# Patient Record
Sex: Male | Born: 1990 | Race: White | Hispanic: No | Marital: Married | Smoking: Never smoker
Health system: Southern US, Community
[De-identification: ages and names within clinical notes are randomized; demographics above are authoritative.]

## PROBLEM LIST (undated history)

## (undated) DIAGNOSIS — F1990 Other psychoactive substance use, unspecified, uncomplicated: Secondary | ICD-10-CM

## (undated) DIAGNOSIS — T401X1A Poisoning by heroin, accidental (unintentional), initial encounter: Secondary | ICD-10-CM

## (undated) DIAGNOSIS — F1910 Other psychoactive substance abuse, uncomplicated: Secondary | ICD-10-CM

---

## 2012-04-05 ENCOUNTER — Emergency Department (HOSPITAL_BASED_OUTPATIENT_CLINIC_OR_DEPARTMENT_OTHER): Payer: Self-pay

## 2012-04-05 ENCOUNTER — Encounter (HOSPITAL_BASED_OUTPATIENT_CLINIC_OR_DEPARTMENT_OTHER): Payer: Self-pay | Admitting: *Deleted

## 2012-04-05 ENCOUNTER — Emergency Department (HOSPITAL_BASED_OUTPATIENT_CLINIC_OR_DEPARTMENT_OTHER)
Admission: EM | Admit: 2012-04-05 | Discharge: 2012-04-05 | Disposition: A | Discharge status: Home / Self Care | Payer: Self-pay | Attending: Emergency Medicine | Admitting: Emergency Medicine

## 2012-04-05 DIAGNOSIS — R51 Headache: Secondary | ICD-10-CM | POA: Insufficient documentation

## 2012-04-05 DIAGNOSIS — R112 Nausea with vomiting, unspecified: Secondary | ICD-10-CM | POA: Insufficient documentation

## 2012-04-05 DIAGNOSIS — R109 Unspecified abdominal pain: Secondary | ICD-10-CM | POA: Insufficient documentation

## 2012-04-05 DIAGNOSIS — R1110 Vomiting, unspecified: Secondary | ICD-10-CM | POA: Insufficient documentation

## 2012-04-05 DIAGNOSIS — R3129 Other microscopic hematuria: Secondary | ICD-10-CM | POA: Insufficient documentation

## 2012-04-05 LAB — BASIC METABOLIC PANEL
BUN: 11 mg/dL (ref 6–23)
CO2: 31 mEq/L (ref 19–32)
Calcium: 9.6 mg/dL (ref 8.4–10.5)
Chloride: 99 mEq/L (ref 96–112)
Creatinine, Ser: 1.1 mg/dL (ref 0.50–1.35)
GFR calc Af Amer: >90 mL/min (ref >90)
GFR calc non Af Amer: >90 mL/min (ref >90)
Glucose, Bld: 95 mg/dL (ref 70–99)
Potassium: 4 mEq/L (ref 3.5–5.1)
Sodium: 140 mEq/L (ref 135–145)

## 2012-04-05 LAB — CBC WITH DIFFERENTIAL/PLATELET
Basophils Absolute: 0.1 10*3/uL (ref 0.0–0.1)
Basophils Relative: 1 % (ref 0–1)
Eosinophils Absolute: 0.9 10*3/uL — ABNORMAL HIGH (ref 0.0–0.7)
Eosinophils Relative: 10 % — ABNORMAL HIGH (ref 0–5)
HCT: 47.8 % (ref 39.0–52.0)
Hemoglobin: 16.1 g/dL (ref 13.0–17.0)
Lymphocytes Relative: 25 % (ref 12–46)
Lymphs Abs: 2.3 10*3/uL (ref 0.7–4.0)
MCH: 30.4 pg (ref 26.0–34.0)
MCHC: 33.7 g/dL (ref 30.0–36.0)
MCV: 90.4 fL (ref 78.0–100.0)
Monocytes Absolute: 1 10*3/uL (ref 0.1–1.0)
Monocytes Relative: 11 % (ref 3–12)
Neutro Abs: 4.9 10*3/uL (ref 1.7–7.7)
Neutrophils Relative %: 53 % (ref 43–77)
Platelets: 211 10*3/uL (ref 150–400)
RBC: 5.29 MIL/uL (ref 4.22–5.81)
RDW: 13.5 % (ref 11.5–15.5)
WBC: 9.3 10*3/uL (ref 4.0–10.5)

## 2012-04-05 LAB — URINE MICROSCOPIC-ADD ON

## 2012-04-05 LAB — URINALYSIS, ROUTINE W REFLEX MICROSCOPIC
Glucose, UA: NEGATIVE mg/dL
Ketones, ur: NEGATIVE mg/dL
Nitrite: NEGATIVE
Specific Gravity, Urine: 1.025 (ref 1.005–1.030)
pH: 6.5 (ref 5.0–8.0)

## 2012-04-05 MED ORDER — IOHEXOL 300 MG/ML  SOLN
20.0000 mL | Freq: Once | INTRAMUSCULAR | Status: AC | PRN
  Administered 2012-04-05: 40 mL via ORAL

## 2012-04-05 MED ORDER — ONDANSETRON HCL 4 MG/2ML IJ SOLN
4.0000 mg | Freq: Once | INTRAMUSCULAR | Status: AC
  Administered 2012-04-05: 4 mg via INTRAVENOUS
  Filled 2012-04-05: qty 2

## 2012-04-05 MED ORDER — IOHEXOL 300 MG/ML  SOLN
100.0000 mL | Freq: Once | INTRAMUSCULAR | Status: AC | PRN
  Administered 2012-04-05: 100 mL via INTRAVENOUS

## 2012-04-05 MED ORDER — PROMETHAZINE HCL 25 MG PO TABS: 25.0000 mg | ORAL_TABLET | Freq: Four times a day (QID) | ORAL | Status: AC | PRN

## 2012-04-05 MED ORDER — MORPHINE SULFATE 2 MG/ML IJ SOLN
2.0000 mg | Freq: Once | INTRAMUSCULAR | Status: AC
  Administered 2012-04-05: 2 mg via INTRAVENOUS
  Filled 2012-04-05: qty 1

## 2012-04-05 NOTE — ED Notes (Signed)
Patient states he has had vomiting and abdominal cramping  for the last 2 days, which is associated with decreased energy.  Denies diarrhea.

## 2012-04-05 NOTE — ED Provider Notes (Signed)
History  This chart was scribed for Rolan Bucco, MD by Shari Heritage. The patient was seen in room MH08/MH08. Patient's care was started at 1902.     CSN: [id number]  Arrival date & time 04/05/12  Mikle Bosworth   First MD Initiated Contact with Patient 04/05/12 1950      Chief Complaint  Patient presents with  . Emesis  . Abdominal Pain    The history is provided by the patient. No language interpreter was used.   Alexsander Cavins is a 21 y.o. male who presents to the Emergency Department complaining of persistent emesis with associated mild to moderate, lower abdominal cramping and HA onset 2 days ago. Patient says the emesis started immediately after he ate dinner on Friday. He states that he vomits whenever he eats or drinks. Patient has felt warm, but he hasn't measured his temperature at home. No diarrhea. No blood in emesis. No dysuria. No cough or congestion. Patient denies any recent colds. He also denies any abdominal surgical history. Her reports no other significant medical or family history. He has never smoked.  History  Substance Use Topics  . Smoking status: Never Smoker   . Smokeless tobacco: Not on file  . Alcohol Use: No      Review of Systems  Constitutional: Negative for fever, chills, diaphoresis and fatigue.  HENT: Negative for congestion, rhinorrhea and sneezing.   Eyes: Negative.   Respiratory: Negative for cough, chest tightness and shortness of breath.   Cardiovascular: Negative for chest pain and leg swelling.  Gastrointestinal: Positive for nausea, vomiting and abdominal pain. Negative for diarrhea and blood in stool.  Genitourinary: Negative for dysuria, frequency, hematuria, flank pain and difficulty urinating.  Musculoskeletal: Negative for back pain and arthralgias.  Skin: Negative for rash.  Neurological: Positive for headaches. Negative for dizziness, speech difficulty, weakness and numbness.    Allergies  Review of patient's allergies indicates no  known allergies.  Home Medications   Current Outpatient Rx  Name Route Sig Dispense Refill  . PROMETHAZINE HCL 25 MG PO TABS Oral Take 1 tablet (25 mg total) by mouth every 6 (six) hours as needed for nausea. 30 tablet 0    BP 132/91  Pulse 50  Temp 98.3 F (36.8 C) (Oral)  Resp 20  Ht 5\' 10"  (1.778 m)  Wt 200 lb (90.719 kg)  BMI 28.70 kg/m2  SpO2 99%  Physical Exam  Constitutional: He is oriented to person, place, and time. He appears well-developed and well-nourished.  HENT:  Head: Normocephalic and atraumatic.  Eyes: Pupils are equal, round, and reactive to light.  Neck: Normal range of motion. Neck supple.  Cardiovascular: Normal rate, regular rhythm and normal heart sounds.   Pulmonary/Chest: Effort normal and breath sounds normal. No respiratory distress. He has no wheezes. He has no rales. He exhibits no tenderness.  Abdominal: Soft. Bowel sounds are normal. There is tenderness in the right lower quadrant. There is no rebound and no guarding.       Moderate tenderness in RLQ. Neg heel to tap. Neg obturater sign.  Genitourinary:       No testicular pain.  Musculoskeletal: Normal range of motion. He exhibits no edema.  Lymphadenopathy:    He has no cervical adenopathy.  Neurological: He is alert and oriented to person, place, and time.  Skin: Skin is warm and dry. No rash noted.  Psychiatric: He has a normal mood and affect.    ED Course  Procedures (including critical care time) DIAGNOSTIC  STUDIES: Oxygen Saturation is 99% on room air, normal by my interpretation.    COORDINATION OF CARE: 8:09pm- Patient informed of current plan for treatment and evaluation and agrees with plan at this time.   Results for orders placed during the hospital encounter of 04/05/12  CBC WITH DIFFERENTIAL      Component Value Range   WBC 9.3  4.0 - 10.5 K/uL   RBC 5.29  4.22 - 5.81 MIL/uL   Hemoglobin 16.1  13.0 - 17.0 g/dL   HCT 16.1  09.6 - 04.5 %   MCV 90.4  78.0 - 100.0 fL    MCH 30.4  26.0 - 34.0 pg   MCHC 33.7  30.0 - 36.0 g/dL   RDW 40.9  81.1 - 91.4 %   Platelets 211  150 - 400 K/uL   Neutrophils Relative 53  43 - 77 %   Neutro Abs 4.9  1.7 - 7.7 K/uL   Lymphocytes Relative 25  12 - 46 %   Lymphs Abs 2.3  0.7 - 4.0 K/uL   Monocytes Relative 11  3 - 12 %   Monocytes Absolute 1.0  0.1 - 1.0 K/uL   Eosinophils Relative 10 (*) 0 - 5 %   Eosinophils Absolute 0.9 (*) 0.0 - 0.7 K/uL   Basophils Relative 1  0 - 1 %   Basophils Absolute 0.1  0.0 - 0.1 K/uL  BASIC METABOLIC PANEL      Component Value Range   Sodium 140  135 - 145 mEq/L   Potassium 4.0  3.5 - 5.1 mEq/L   Chloride 99  96 - 112 mEq/L   CO2 31  19 - 32 mEq/L   Glucose, Bld 95  70 - 99 mg/dL   BUN 11  6 - 23 mg/dL   Creatinine, Ser 7.82  0.50 - 1.35 mg/dL   Calcium 9.6  8.4 - 95.6 mg/dL   GFR calc non Af Amer >90  >90 mL/min   GFR calc Af Amer >90  >90 mL/min  URINALYSIS, ROUTINE W REFLEX MICROSCOPIC      Component Value Range   Color, Urine AMBER (*) YELLOW   APPearance CLEAR  CLEAR   Specific Gravity, Urine 1.025  1.005 - 1.030   pH 6.5  5.0 - 8.0   Glucose, UA NEGATIVE  NEGATIVE mg/dL   Hgb urine dipstick SMALL (*) NEGATIVE   Bilirubin Urine SMALL (*) NEGATIVE   Ketones, ur NEGATIVE  NEGATIVE mg/dL   Protein, ur NEGATIVE  NEGATIVE mg/dL   Urobilinogen, UA 1.0  0.0 - 1.0 mg/dL   Nitrite NEGATIVE  NEGATIVE   Leukocytes, UA NEGATIVE  NEGATIVE  URINE MICROSCOPIC-ADD ON      Component Value Range   Squamous Epithelial / LPF RARE  RARE   WBC, UA 0-2  <3 WBC/hpf   RBC / HPF 7-10  <3 RBC/hpf   Bacteria, UA MANY (*) RARE   Urine-Other MUCOUS PRESENT      Ct Abdomen Pelvis W Contrast  04/05/2012  *RADIOLOGY REPORT*  Clinical Data: 21 year old man with right lower quadrant pain and cramping.  Headache for 2 days.  No significant medical history.  CT ABDOMEN AND PELVIS WITH CONTRAST  Technique:  Multidetector CT imaging of the abdomen and pelvis was performed following the standard protocol  during bolus administration of intravenous contrast.  Contrast: 40mL OMNIPAQUE IOHEXOL 300 MG/ML  SOLN, OMNIPAQUE IOHEXOL 300 MG/ML  SOLN  Comparison: None.  Findings: Dependent atelectasis at the lung bases.  Heart appears within normal limits.  Fatty liver.  The spleen appears normal. Gallbladder and common bile duct appear normal.  Pancreas within normal limits.  Adrenal glands and kidneys also normal.  Both ureters are within normal limits.  Normal appendix identified in the right lower quadrant.  Urinary bladder normal.  No free fluid in the anatomic pelvis.  Small bowel is normal.  Colon appears within normal limits.  No adenopathy or inflammatory changes. Phlebolith is present adjacent to the right inguinal canal.  The abdominal vasculature is within normal limits. 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  No aggressive osseous lesions.  IMPRESSION: Fatty liver.  No acute abnormality.  Original Report Authenticated By: Andreas Newport, M.D.     1. Abdominal pain   2. Microscopic hematuria       MDM  Pt feeling much better after fluids.  Repeat abdominal exam benign.  Will f/u this week with Dr. Littie Deeds in [place], advised will need recheck of urine to make sure that blood clears.  Return if pain or vomiting worsens.      I personally performed the services described in this documentation, which was scribed in my presence.  The recorded information has been reviewed and considered.    Rolan Bucco, MD 04/05/12 2238

## 2014-01-15 IMAGING — CT CT ABD-PELV W/ CM
2 of 4 series · 17 of 46 positions shown, 19 images · IV contrast (APPLIED)
Comparison: None.

CLINICAL DATA: 21-year-old man with right lower quadrant pain and
cramping.  Headache for 2 days.  No significant medical history.

CT ABDOMEN AND PELVIS WITH CONTRAST
TECHNIQUE: Multidetector CT imaging of the abdomen and pelvis was
performed following the standard protocol during bolus
administration of intravenous contrast.
Contrast: 40mL OMNIPAQUE IOHEXOL 300 MG/ML  SOLN, 100mL OMNIPAQUE
IOHEXOL 300 MG/ML  SOLN

[Series 2: abd/pelvis 5.0 b31f · axial · 0.76mm/px · z∈[-472,-12]mm · 14 of 101 slices shown, 16 images]
[im 5/101  soft-tissue]
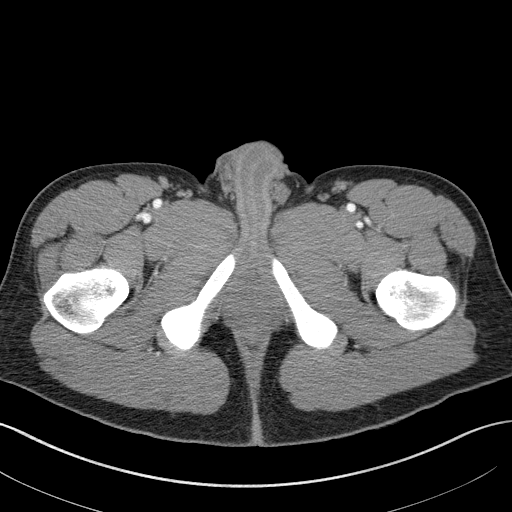
[im 5/101  bone]
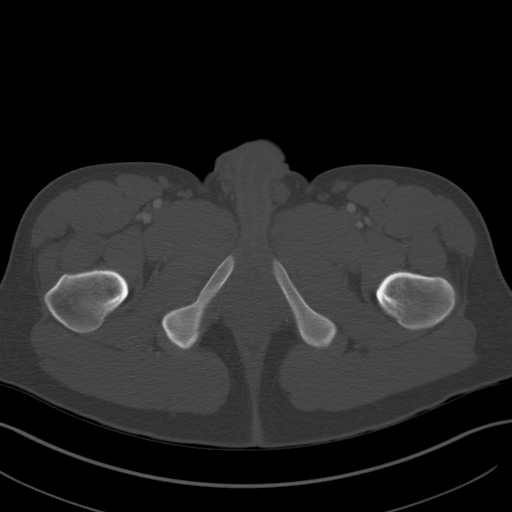
[im 13/101  soft-tissue]
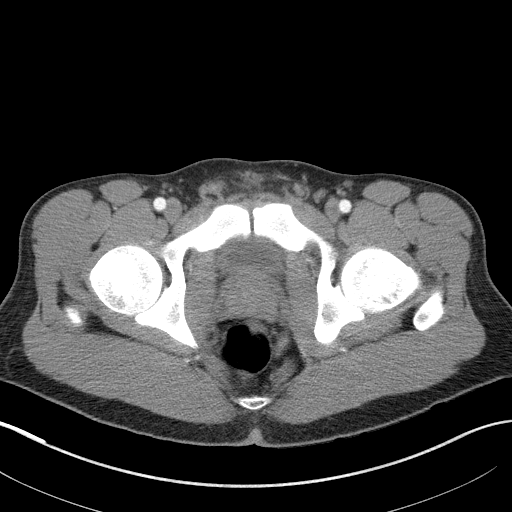
[im 21/101  soft-tissue]
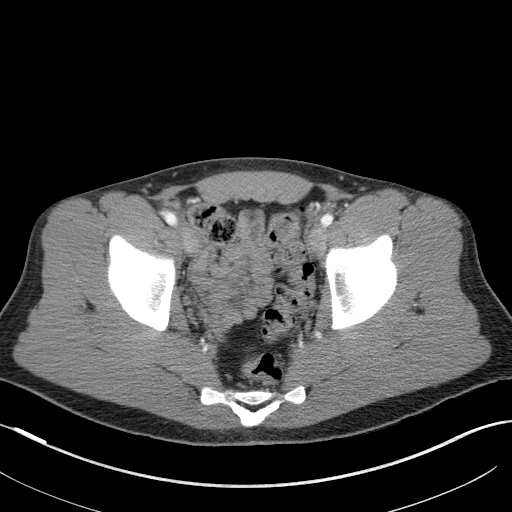
[im 29/101  soft-tissue]
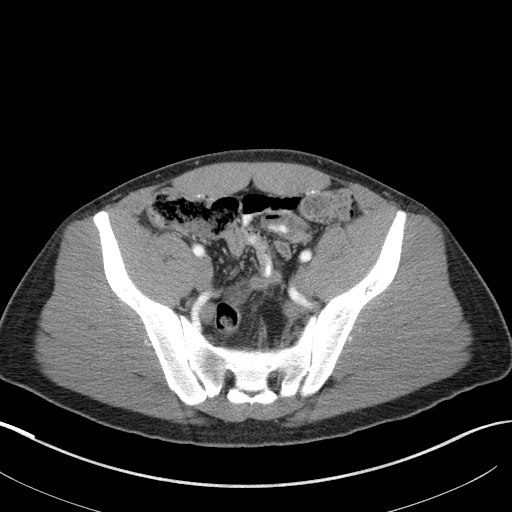
[im 33/101  soft-tissue]
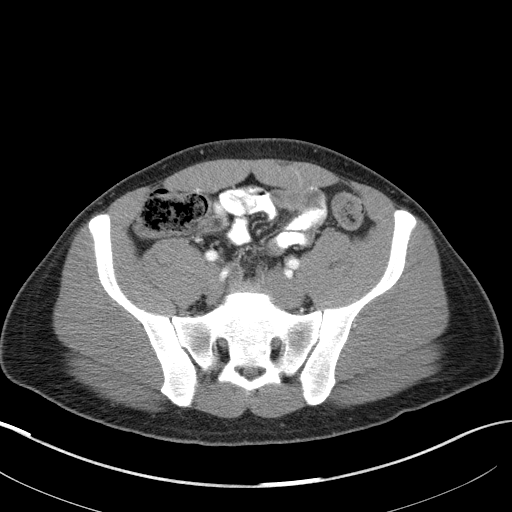
[im 41/101  soft-tissue]
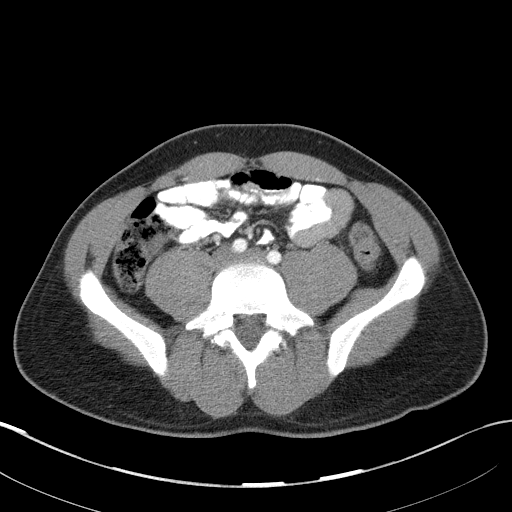
[im 49/101  soft-tissue]
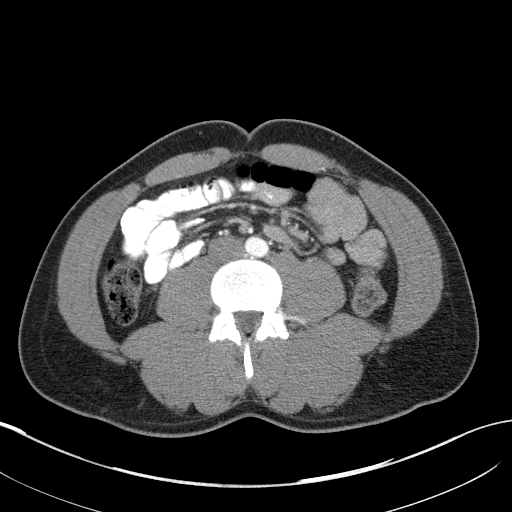
[im 53/101  soft-tissue]
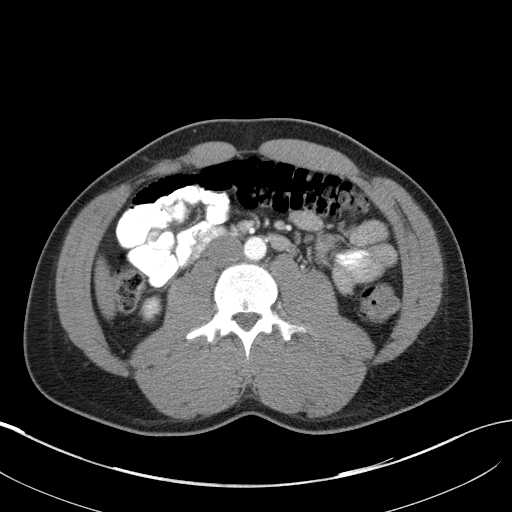
[im 61/101  soft-tissue]
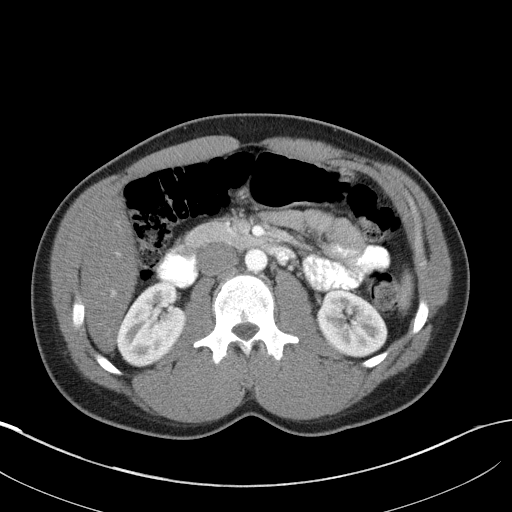
[im 61/101  bone]
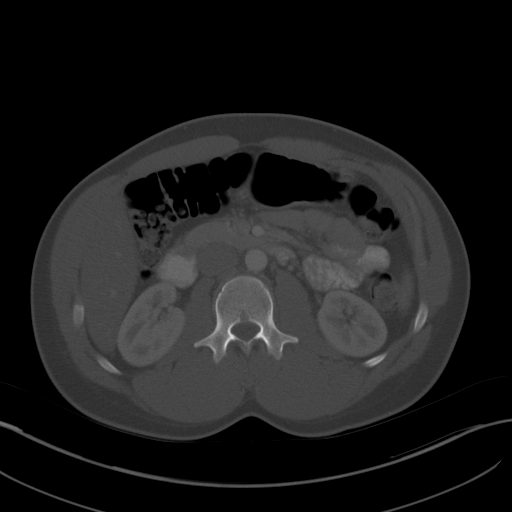
[im 69/101  soft-tissue]
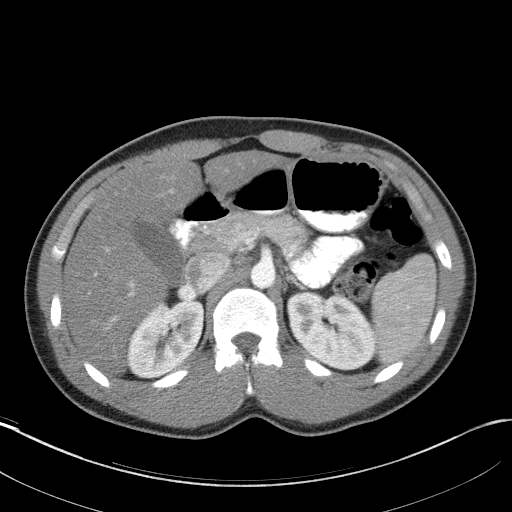
[im 77/101  soft-tissue]
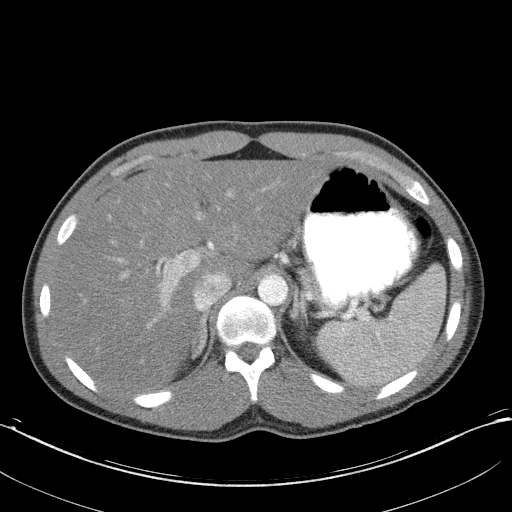
[im 81/101  soft-tissue]
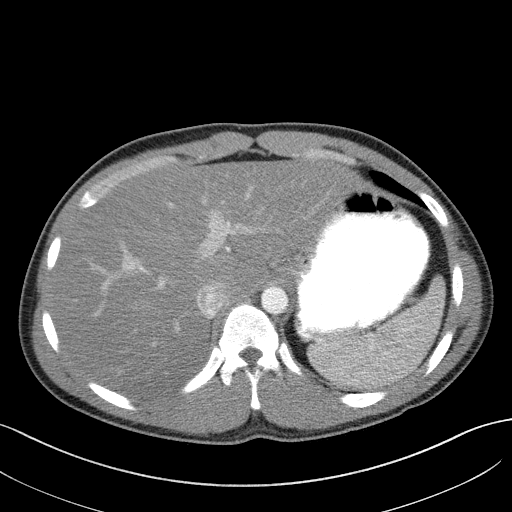
[im 89/101  soft-tissue]
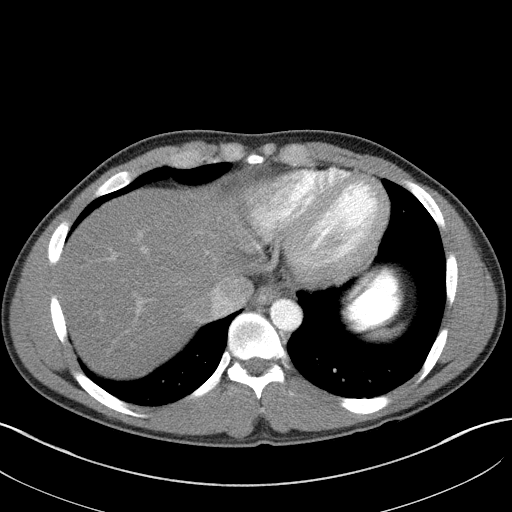
[im 97/101  soft-tissue]
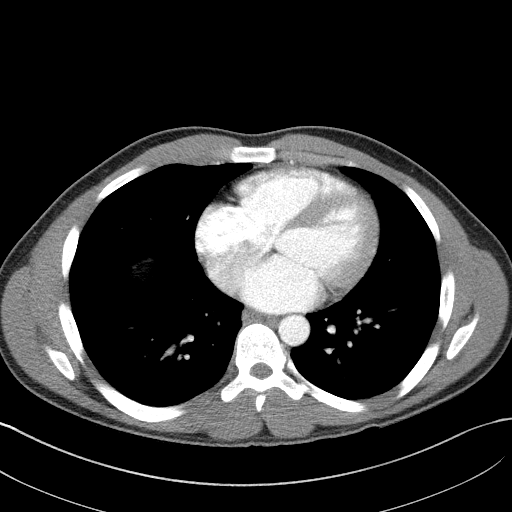

[Series 5: abd/pelvis 3.0 coronal · coronal · 0.91mm/px · 3 of 79 slices shown]
[im 27/79  soft-tissue]
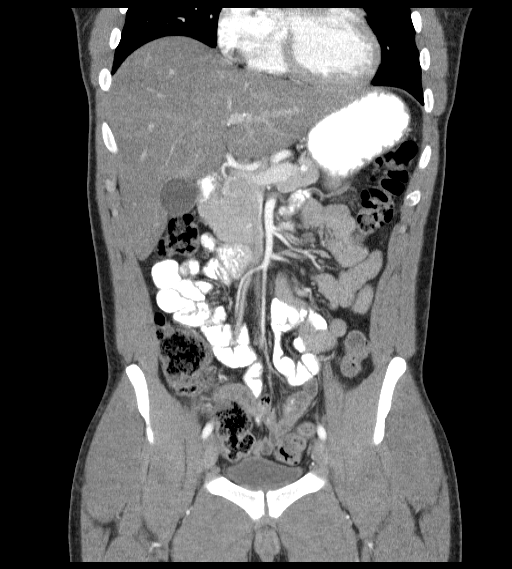
[im 35/79  soft-tissue]
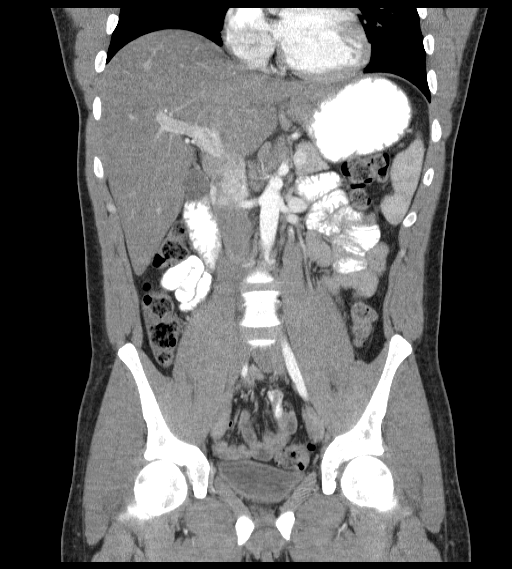
[im 44/79  soft-tissue]
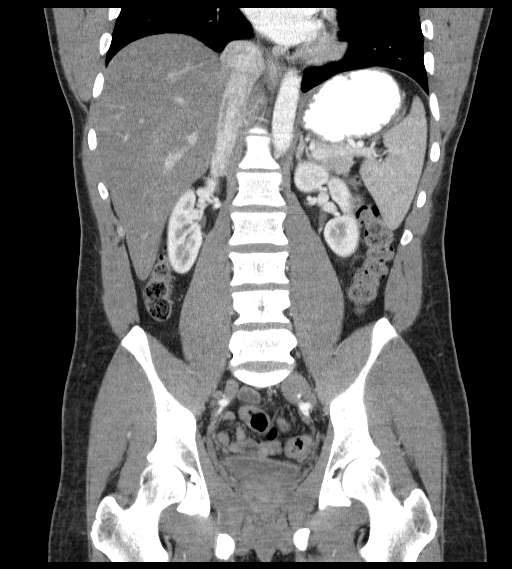

[17 of 46 positions shown; findings below may reference images not displayed]

FINDINGS: Dependent atelectasis at the lung bases.  Heart appears
within normal limits.  Fatty liver.  The spleen appears normal.
Gallbladder and common bile duct appear normal.  Pancreas within
normal limits.  Adrenal glands and kidneys also normal.  Both
ureters are within normal limits.  Normal appendix identified in
the right lower quadrant.  Urinary bladder normal.  No free fluid
in the anatomic pelvis.  Small bowel is normal.  Colon appears
within normal limits.  No adenopathy or inflammatory changes.
Phlebolith is present adjacent to the right inguinal canal.  The
abdominal vasculature is within normal limits. Bilateral L5 pars
defects are present with grade 1 anterolisthesis.  No aggressive
osseous lesions.
IMPRESSION: Fatty liver.  No acute abnormality.

## 2021-02-22 ENCOUNTER — Encounter: Payer: Self-pay | Admitting: Emergency Medicine

## 2021-02-22 ENCOUNTER — Emergency Department (HOSPITAL_BASED_OUTPATIENT_CLINIC_OR_DEPARTMENT_OTHER)
Admission: EM | Admit: 2021-02-22 | Discharge: 2021-02-22 | Disposition: A | Discharge status: Home / Self Care | Payer: Self-pay | Attending: Emergency Medicine | Admitting: Emergency Medicine

## 2021-02-22 ENCOUNTER — Encounter (HOSPITAL_BASED_OUTPATIENT_CLINIC_OR_DEPARTMENT_OTHER): Payer: Self-pay

## 2021-02-22 ENCOUNTER — Other Ambulatory Visit: Payer: Self-pay

## 2021-02-22 DIAGNOSIS — T401X1A Poisoning by heroin, accidental (unintentional), initial encounter: Secondary | ICD-10-CM | POA: Insufficient documentation

## 2021-02-22 DIAGNOSIS — L02414 Cutaneous abscess of left upper limb: Secondary | ICD-10-CM | POA: Insufficient documentation

## 2021-02-22 DIAGNOSIS — F1910 Other psychoactive substance abuse, uncomplicated: Secondary | ICD-10-CM | POA: Insufficient documentation

## 2021-02-22 DIAGNOSIS — L0291 Cutaneous abscess, unspecified: Secondary | ICD-10-CM

## 2021-02-22 DIAGNOSIS — F1990 Other psychoactive substance use, unspecified, uncomplicated: Secondary | ICD-10-CM | POA: Insufficient documentation

## 2021-02-22 HISTORY — DX: Poisoning by heroin, accidental (unintentional), initial encounter: T40.1X1A

## 2021-02-22 HISTORY — DX: Other psychoactive substance abuse, uncomplicated: F19.10

## 2021-02-22 HISTORY — DX: Other psychoactive substance use, unspecified, uncomplicated: F19.90

## 2021-02-22 LAB — BASIC METABOLIC PANEL
Anion gap: 8 (ref 5–15)
BUN: 12 mg/dL (ref 6–20)
CO2: 31 mmol/L (ref 22–32)
Calcium: 8.7 mg/dL — ABNORMAL LOW (ref 8.9–10.3)
Chloride: 98 mmol/L (ref 98–111)
Creatinine, Ser: 0.98 mg/dL (ref 0.61–1.24)
GFR, Estimated: >60 mL/min (ref >60)
Glucose, Bld: 70 mg/dL (ref 70–99)
Potassium: 3.6 mmol/L (ref 3.5–5.1)
Sodium: 137 mmol/L (ref 135–145)

## 2021-02-22 LAB — CBC WITH DIFFERENTIAL/PLATELET
Abs Immature Granulocytes: 0.01 10*3/uL (ref 0.00–0.07)
Basophils Absolute: 0.1 10*3/uL (ref 0.0–0.1)
Basophils Relative: 1 %
Eosinophils Absolute: 0.3 10*3/uL (ref 0.0–0.5)
Eosinophils Relative: 5 %
HCT: 38.8 % — ABNORMAL LOW (ref 39.0–52.0)
Hemoglobin: 12.2 g/dL — ABNORMAL LOW (ref 13.0–17.0)
Immature Granulocytes: 0 %
Lymphocytes Relative: 31 %
Lymphs Abs: 1.6 10*3/uL (ref 0.7–4.0)
MCH: 28.1 pg (ref 26.0–34.0)
MCHC: 31.4 g/dL (ref 30.0–36.0)
MCV: 89.4 fL (ref 80.0–100.0)
Monocytes Absolute: 0.6 10*3/uL (ref 0.1–1.0)
Monocytes Relative: 11 %
Neutro Abs: 2.7 10*3/uL (ref 1.7–7.7)
Neutrophils Relative %: 52 %
Platelets: 255 10*3/uL (ref 150–400)
RBC: 4.34 MIL/uL (ref 4.22–5.81)
RDW: 13.1 % (ref 11.5–15.5)
WBC: 5.2 10*3/uL (ref 4.0–10.5)
nRBC: 0 % (ref 0.0–0.2)

## 2021-02-22 MED ORDER — SULFAMETHOXAZOLE-TRIMETHOPRIM 800-160 MG PO TABS: 1.0000 | ORAL_TABLET | Freq: Two times a day (BID) | ORAL | 0 refills | Status: AC

## 2021-02-22 MED ORDER — LIDOCAINE-EPINEPHRINE (PF) 2 %-1:200000 IJ SOLN
10.0000 mL | Freq: Once | INTRAMUSCULAR | Status: AC
  Administered 2021-02-22: 10 mL
  Filled 2021-02-22: qty 20

## 2021-02-22 NOTE — ED Triage Notes (Signed)
Pt c/o abscess to left inner forearm x 2 week-denies recent IV drug use-NAD-steady gait

## 2021-02-22 NOTE — ED Provider Notes (Signed)
[facility] EMERGENCY DEPARTMENT Provider Note   CSN: [id number] Arrival date & time: 02/22/21  1930     History Chief Complaint  Patient presents with   Abscess    Maurice Hughes is a 30 y.o. male.  HPI     Maurice Hughes is a 30 y.o. male, with a history of IV drug use, presenting to the ED with suspected abscess to the left forearm worsening over the last 2 weeks. Last IV drug use reportedly a month ago.  Denies fever, numbness, weakness, other injuries, or any other complaints.   Past Medical History:  Diagnosis Date   Accidental heroin overdose (HCC)    IV drug user    Substance abuse [facility])     Patient Active Problem List   Diagnosis Date Noted   IV drug user 02/22/2021   Accidental heroin overdose (HCC) 02/22/2021   Substance abuse (HCC) 02/22/2021    No past surgical history on file.     No family history on file.  Social History   Tobacco Use   Smoking status: Never   Smokeless tobacco: Never  Substance Use Topics   Alcohol use: No   Drug use: Not Currently    Types: IV    Home Medications Prior to Admission medications   Medication Sig Start Date End Date Taking? Authorizing Provider  sulfamethoxazole-trimethoprim (BACTRIM DS) 800-160 MG tablet Take 1 tablet by mouth 2 (two) times daily for 7 days. 02/22/21 03/01/21 Yes Daneisha Surges C, PA-C  promethazine (PHENERGAN) 25 MG tablet Take 1 tablet (25 mg total) by mouth every 6 (six) hours as needed for nausea. 04/05/12 04/12/12  Rolan Bucco, MD    Allergies    Patient has no known allergies.  Review of Systems   Review of Systems  Constitutional:  Negative for fever.  Gastrointestinal:  Negative for nausea and vomiting.  Skin:  Positive for color change.  Neurological:  Negative for weakness and numbness.   Physical Exam Updated Vital Signs BP 125/78 (BP Location: Left Arm)   Pulse 73   Temp 98.1 F (36.7 C) (Oral)   Resp 18   Ht 5\' 9"  (1.753 m)   Wt 104.3 kg   SpO2 99%   BMI  33.97 kg/m   Physical Exam Vitals and nursing note reviewed.  Constitutional:      General: He is not in acute distress.    Appearance: Normal appearance. He is well-developed. He is not diaphoretic.  HENT:     Head: Normocephalic and atraumatic.  Eyes:     Conjunctiva/sclera: Conjunctivae normal.  Cardiovascular:     Rate and Rhythm: Normal rate and regular rhythm.     Pulses:          Radial pulses are 2+ on the right side and 2+ on the left side.  Pulmonary:     Effort: Pulmonary effort is normal.  Musculoskeletal:     Cervical back: Neck supple.     Comments: Approximately 3 cm in diameter area of redness, swelling, tenderness, with central fluctuance to the left anterior forearm.  No surrounding tenderness, swelling, or pain. No pain with range of motion of the wrist, supination or pronation of the forearm, or movement of the elbow. Multiple wounds consistent with track marks in various stages of healing noted on both arms.  Skin:    General: Skin is warm and dry.     Coloration: Skin is not pale.  Neurological:     Mental Status: He is alert.  Comments: Sensation grossly intact to light touch through each of the nerve distributions of the bilateral upper extremities. Abduction and adduction of the fingers intact against resistance. Grip strength equal bilaterally. Supination and pronation intact against resistance. Strength 5/5 through the cardinal directions of the bilateral wrists. Strength 5/5 with flexion and extension of the bilateral elbows. Patient can touch the thumb to each one of the fingertips without difficulty.  Patient can hold the "OK" sign against resistance.  Psychiatric:        Behavior: Behavior normal.    ED Results / Procedures / Treatments   Labs (all labs ordered are listed, but only abnormal results are displayed) Labs Reviewed  CBC WITH DIFFERENTIAL/PLATELET - Abnormal; Notable for the following components:      Result Value   Hemoglobin  12.2 (*)    HCT 38.8 (*)    All other components within normal limits  BASIC METABOLIC PANEL - Abnormal; Notable for the following components:   Calcium 8.7 (*)    All other components within normal limits  CULTURE, BLOOD (ROUTINE X 2)  CULTURE, BLOOD (ROUTINE X 2)  AEROBIC CULTURE W GRAM STAIN (SUPERFICIAL SPECIMEN)    EKG None  Radiology No results found.  Procedures Ultrasound ED Soft Tissue  Date/Time: 02/22/2021 9:07 PM Performed by: Anselm Pancoast, PA-C Authorized by: Anselm Pancoast, PA-C   Procedure details:    Indications: localization of abscess and evaluate for cellulitis     Transverse view:  Visualized   Longitudinal view:  Visualized   Images: archived   Location:    Location: upper extremity     Side:  Left Findings:     abscess present    cellulitis present    no foreign body present Comments:     Ultrasound was also used to assure there were no overlying vessels or other important structures. Marland Kitchen.Incision and Drainage  Date/Time: 02/22/2021 11:00 PM Performed by: Anselm Pancoast, PA-C Authorized by: Anselm Pancoast, PA-C   Consent:    Consent obtained:  Verbal   Consent given by:  Patient   Risks, benefits, and alternatives were discussed: yes     Risks discussed:  Bleeding, incomplete drainage, pain, infection and damage to other organs Universal protocol:    Procedure explained and questions answered to patient or proxy's satisfaction: yes     Patient identity confirmed:  Verbally with patient and provided demographic data Location:    Type:  Abscess   Size:  3cm   Location:  Upper extremity   Upper extremity location:  Arm   Arm location:  L lower arm Pre-procedure details:    Skin preparation:  Povidone-iodine Sedation:    Sedation type:  None Anesthesia:    Anesthesia method:  Local infiltration   Local anesthetic:  Lidocaine 2% WITH epi Procedure type:    Complexity:  Simple Procedure details:    Incision types:  Single straight   Incision  depth:  Subcutaneous   Wound management:  Irrigated with saline and extensive cleaning   Drainage:  Purulent   Drainage amount:  Moderate   Wound treatment:  Wound left open Post-procedure details:    Procedure completion:  Tolerated well, no immediate complications Comments:     Light pressure used to slowly cut through superficial tissue using #15 blade. No further cutting was performed once purulence was seen flowing from the abscess.   Medications Ordered in ED Medications  lidocaine-EPINEPHrine (XYLOCAINE W/EPI) 2 %-1:200000 (PF) injection 10 mL (10 mLs  Infiltration Given by Other 02/22/21 2249)    ED Course  I have reviewed the triage vital signs and the nursing notes.  Pertinent labs & imaging results that were available during my care of the patient were reviewed by me and considered in my medical decision making (see chart for details).    MDM Rules/Calculators/A&P                          Patient presents with abscess to the left anterior forearm. Low suspicion for sepsis.  No evidence of neurovascular compromise. Circulation, motor function, sensation verified intact before and after I&D procedure. The patient was given instructions for home care as well as return precautions. Patient voices understanding of these instructions, accepts the plan, and is comfortable with discharge.  I reviewed and interpreted the patient's labs.   Final Clinical Impression(s) / ED Diagnoses Final diagnoses:  Abscess    Rx / DC Orders ED Discharge Orders          Ordered    sulfamethoxazole-trimethoprim (BACTRIM DS) 800-160 MG tablet  2 times daily        02/22/21 2317             Anselm Pancoast, PA-C 02/23/21 0009    Virgina Norfolk, DO 02/23/21 [phone]

## 2021-02-25 LAB — AEROBIC CULTURE W GRAM STAIN (SUPERFICIAL SPECIMEN): Culture: NORMAL

## 2021-02-26 ENCOUNTER — Telehealth: Payer: Self-pay

## 2021-02-26 NOTE — Telephone Encounter (Signed)
Post ED Visit - Positive Culture Follow-up  Culture report reviewed by antimicrobial stewardship pharmacist: [facility] [x]  , Pharm.D. []  Loleta Dicker, .D., BCPS AQ-ID []  Celedonio Miyamoto, Pharm.D., BCPS []  [address], Pharm.D., BCPS []  Robbins, Garvin Fila.D., BCPS, AAHIVP []  , Pharm.D., BCPS, AAHIVP []  Georgina Pillion, PharmD, BCPS []  , PharmD, BCPS []  [place], PharmD, BCPS []  [address], PharmD []  , PharmD, BCPS []  Estella Husk, PharmD  Pharmacy Team []  Lysle Pearl, PharmD []  , PharmD []  Phillips Climes, PharmD []  , Rph []  Agapito Games) , PharmD []  Verlan Friends, PharmD []  , PharmD []  Mervyn Gay, PharmD []  , PharmD []  Vinnie Level, PharmD []  [facility], PharmD []  , PharmD []  Len Childs, PharmD  Wound culture Treated with Sulfamethoxazole-Trimethoprim and no further patient follow-up is required at this time.  02/26/2021, 8:45 AM

## 2021-02-28 LAB — CULTURE, BLOOD (ROUTINE X 2)
Culture: NO GROWTH
Culture: NO GROWTH
Special Requests: ADEQUATE
Special Requests: ADEQUATE

## 2022-08-06 DIAGNOSIS — Z23 Encounter for immunization: Secondary | ICD-10-CM

## 2022-08-06 NOTE — Progress Notes (Signed)
Patient received Fluarix Quadrivalent 0.69ml IM Injection in Left deltoid. Lot#[id number], Exp Date: 06.30.2024. Patient feeling well with no adverse reactions.
